# Patient Record
Sex: Male | Born: 1945 | Race: White | Hispanic: No | Marital: Married | Smoking: Never smoker
Health system: Southern US, Community
[De-identification: ages and names within clinical notes are randomized; demographics above are authoritative.]

---

## 2018-04-09 ENCOUNTER — Emergency Department: Payer: Medicare HMO

## 2018-04-09 ENCOUNTER — Emergency Department
Admission: EM | Admit: 2018-04-09 | Discharge: 2018-04-09 | Disposition: A | Discharge status: Other Acute Inpatient Hospital | Payer: Medicare HMO | Attending: Emergency Medicine | Admitting: Emergency Medicine

## 2018-04-09 DIAGNOSIS — S43152A Posterior dislocation of left acromioclavicular joint, initial encounter: Secondary | ICD-10-CM | POA: Diagnosis present

## 2018-04-09 DIAGNOSIS — W868XXA Exposure to other electric current, initial encounter: Secondary | ICD-10-CM | POA: Diagnosis not present

## 2018-04-09 DIAGNOSIS — Y999 Unspecified external cause status: Secondary | ICD-10-CM | POA: Insufficient documentation

## 2018-04-09 DIAGNOSIS — T754XXA Electrocution, initial encounter: Secondary | ICD-10-CM | POA: Diagnosis not present

## 2018-04-09 DIAGNOSIS — Y929 Unspecified place or not applicable: Secondary | ICD-10-CM | POA: Insufficient documentation

## 2018-04-09 DIAGNOSIS — M25512 Pain in left shoulder: Secondary | ICD-10-CM

## 2018-04-09 DIAGNOSIS — Y939 Activity, unspecified: Secondary | ICD-10-CM | POA: Insufficient documentation

## 2018-04-09 DIAGNOSIS — S43022A Posterior subluxation of left humerus, initial encounter: Secondary | ICD-10-CM

## 2018-04-09 LAB — CBC WITH DIFFERENTIAL/PLATELET
Abs Immature Granulocytes: 0.11 10*3/uL — ABNORMAL HIGH (ref 0.00–0.07)
Basophils Absolute: 0.1 10*3/uL (ref 0.0–0.1)
Basophils Relative: 1 %
EOS ABS: 0.1 10*3/uL (ref 0.0–0.5)
Eosinophils Relative: 1 %
HCT: 45.1 % (ref 39.0–52.0)
Hemoglobin: 15.3 g/dL (ref 13.0–17.0)
Immature Granulocytes: 1 %
Lymphocytes Relative: 25 %
Lymphs Abs: 3.2 10*3/uL (ref 0.7–4.0)
MCH: 29.5 pg (ref 26.0–34.0)
MCHC: 33.9 g/dL (ref 30.0–36.0)
MCV: 86.9 fL (ref 80.0–100.0)
Monocytes Absolute: 0.9 10*3/uL (ref 0.1–1.0)
Monocytes Relative: 7 %
Neutro Abs: 8.4 10*3/uL — ABNORMAL HIGH (ref 1.7–7.7)
Neutrophils Relative %: 65 %
Platelets: 379 10*3/uL (ref 150–400)
RBC: 5.19 MIL/uL (ref 4.22–5.81)
RDW: 14.6 % (ref 11.5–15.5)
WBC: 12.8 10*3/uL — ABNORMAL HIGH (ref 4.0–10.5)
nRBC: 0 % (ref 0.0–0.2)

## 2018-04-09 LAB — BASIC METABOLIC PANEL
Anion gap: 16 — ABNORMAL HIGH (ref 5–15)
BUN: 18 mg/dL (ref 8–23)
CO2: 17 mmol/L — ABNORMAL LOW (ref 22–32)
Calcium: 8.8 mg/dL — ABNORMAL LOW (ref 8.9–10.3)
Chloride: 106 mmol/L (ref 98–111)
Creatinine, Ser: 1.45 mg/dL — ABNORMAL HIGH (ref 0.61–1.24)
GFR calc Af Amer: 55 mL/min — ABNORMAL LOW (ref >60)
GFR calc non Af Amer: 47 mL/min — ABNORMAL LOW (ref >60)
GLUCOSE: 137 mg/dL — AB (ref 70–99)
Potassium: 3.8 mmol/L (ref 3.5–5.1)
Sodium: 139 mmol/L (ref 135–145)

## 2018-04-09 LAB — TROPONIN I: Troponin I: <0.03 ng/mL (ref <0.03)

## 2018-04-09 LAB — CK: Total CK: 75 U/L (ref 49–397)

## 2018-04-09 MED ORDER — PROPOFOL 10 MG/ML IV BOLUS: 0.5000 mg/kg | Freq: Once | INTRAVENOUS | Status: DC

## 2018-04-09 MED ORDER — PROPOFOL 10 MG/ML IV BOLUS
INTRAVENOUS | Status: AC | PRN
  Administered 2018-04-09: 55.55 mg via INTRAVENOUS

## 2018-04-09 MED ORDER — MORPHINE SULFATE (PF) 4 MG/ML IV SOLN
4.0000 mg | Freq: Once | INTRAVENOUS | Status: AC
  Administered 2018-04-09: 4 mg via INTRAVENOUS
  Filled 2018-04-09: qty 1

## 2018-04-09 MED ORDER — PROPOFOL 10 MG/ML IV BOLUS
INTRAVENOUS | Status: AC
  Filled 2018-04-09: qty 20

## 2018-04-09 MED ORDER — SODIUM CHLORIDE 0.9 % IV SOLN
Freq: Once | INTRAVENOUS | Status: AC
  Administered 2018-04-09: 13:00:00 via INTRAVENOUS

## 2018-04-09 NOTE — ED Notes (Signed)
ED Provider at bedside. 

## 2018-04-09 NOTE — ED Notes (Signed)
Patient transported to X-ray 
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
## 2018-04-09 NOTE — ED Notes (Signed)
Sedation ended. Pt off oxygen and able to communicate with no residual effects. States pain is "just an ache".
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
